# Patient Record
Sex: Male | Born: 1955 | Race: White | Hispanic: No | Marital: Single | Smoking: Never smoker
Health system: Southern US, Community
[De-identification: ages and names within clinical notes are randomized; demographics above are authoritative.]

---

## 2006-11-11 ENCOUNTER — Other Ambulatory Visit: Payer: Self-pay

## 2006-11-11 ENCOUNTER — Ambulatory Visit: Payer: Self-pay | Admitting: Urology

## 2006-11-12 ENCOUNTER — Ambulatory Visit: Payer: Self-pay | Admitting: Urology

## 2007-01-11 ENCOUNTER — Ambulatory Visit: Payer: Self-pay | Admitting: Family Medicine

## 2007-12-23 ENCOUNTER — Ambulatory Visit: Payer: Self-pay | Admitting: Orthopedic Surgery

## 2009-07-05 IMAGING — CR DG KNEE COMPLETE 4+V*L*
1 series · 4 of 4 positions shown · non-contrast
Comparison: none

REASON FOR EXAM: Pain
COMMENTS:

PROCEDURE:     KDR - KDXR KNEE LT COMP WITH OBLIQUES  - January 11, 2007  [DATE]
RESULT:     There does not appear to be evidence of fracture, dislocation or
malalignment.

[Series 2: view not recorded · 0.17mm/px · 4 of 4 slices shown]
[im 1/4]
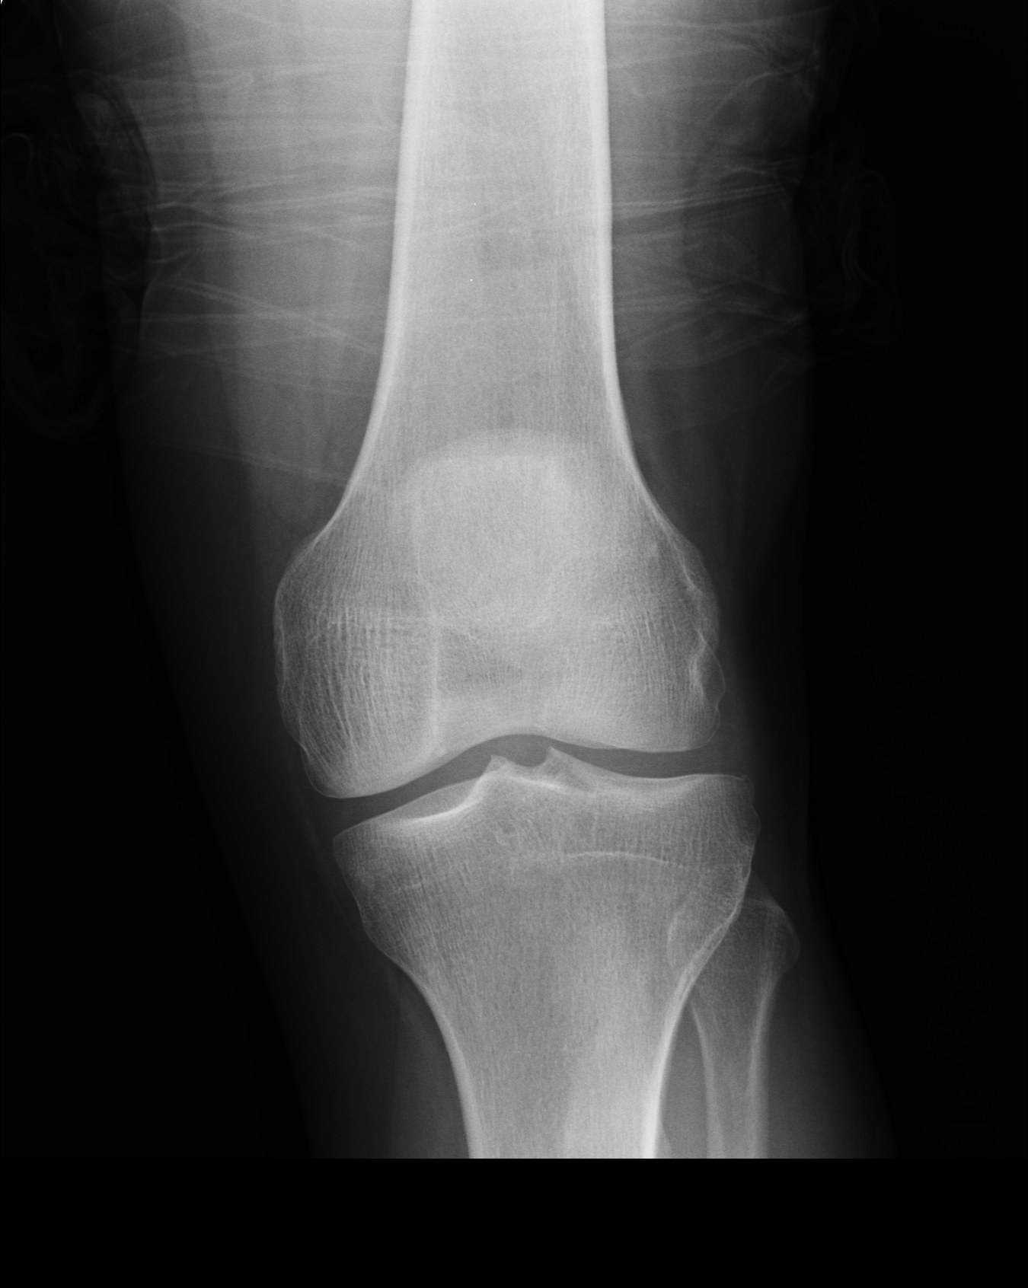
[im 2/4]
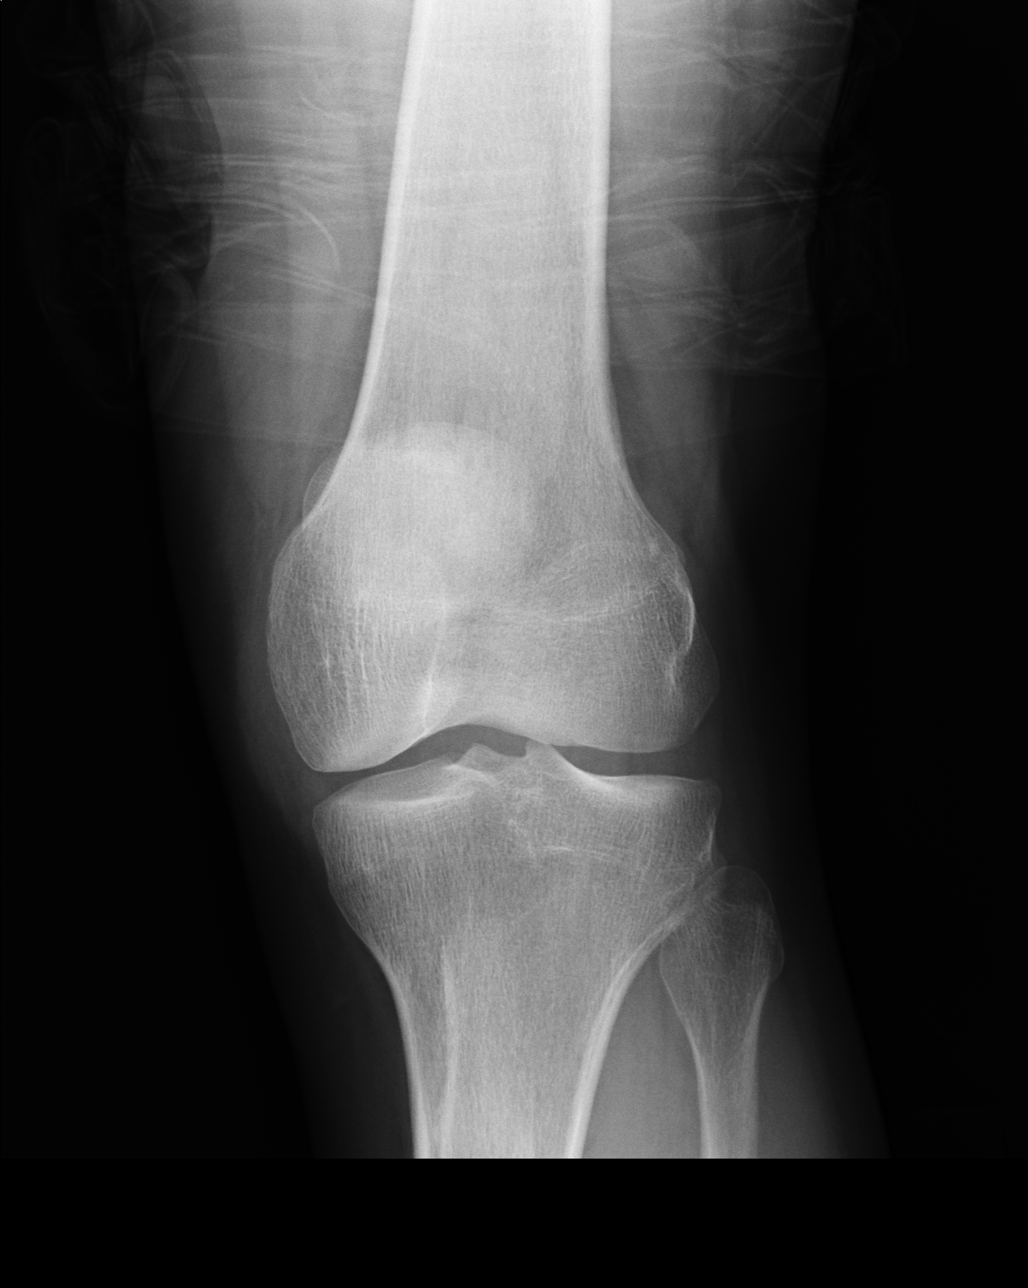
[im 3/4]
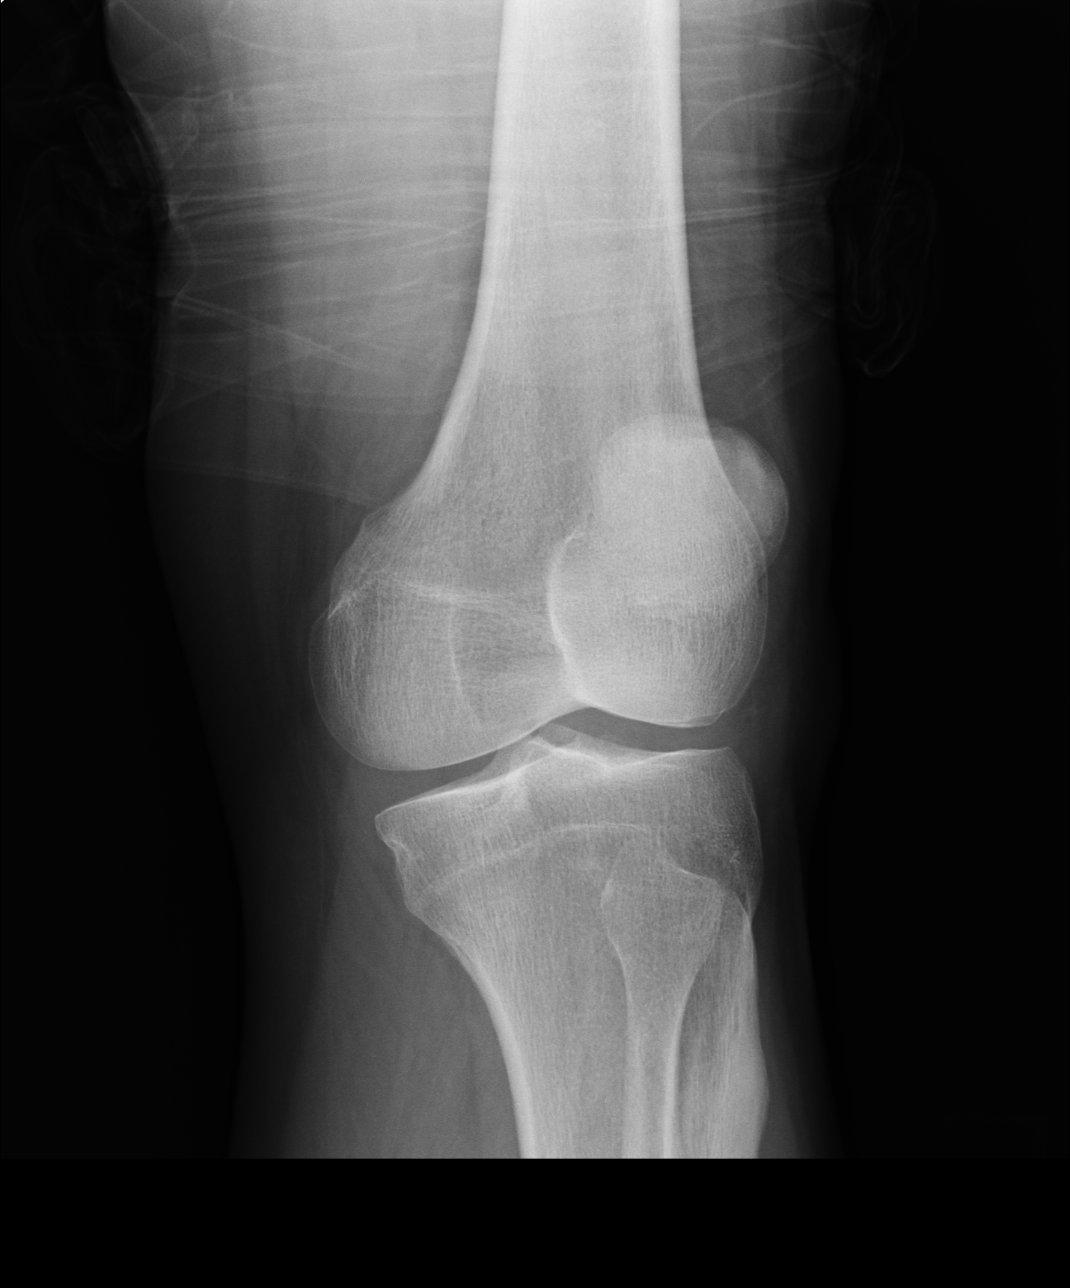
[im 4/4]
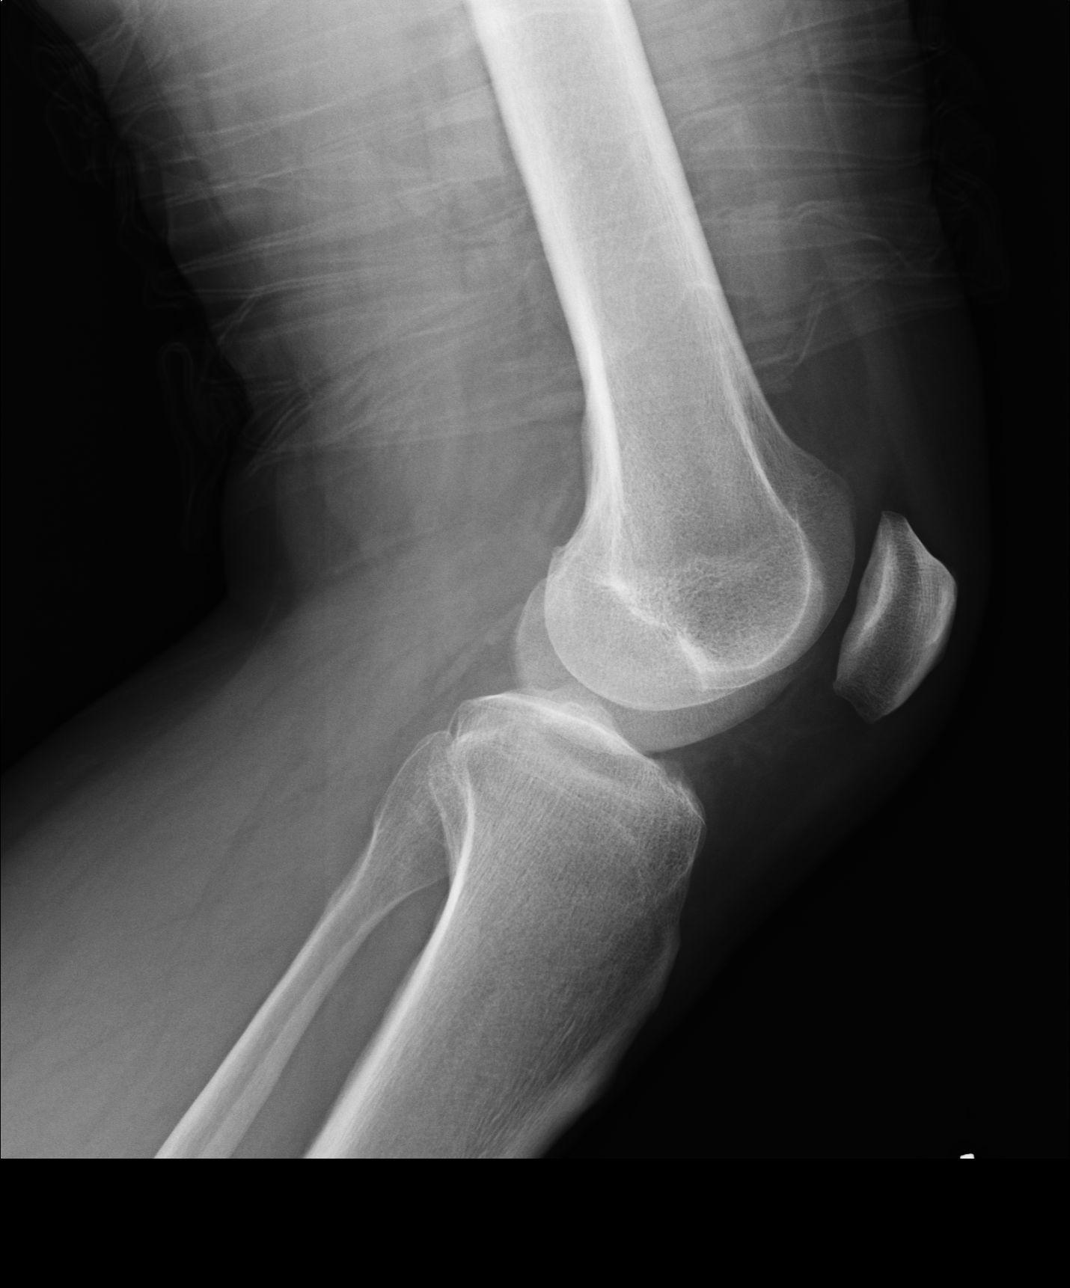

[4 of 4 positions shown; findings below may reference images not displayed]

IMPRESSION: 1.     Unremarkable LEFT knee.
2.     If there is persistent clinical concern or persistent complaints of
pain, repeat evaluation in 7-10 days is recommended, if clinically
warranted.

## 2018-11-04 ENCOUNTER — Ambulatory Visit (INDEPENDENT_AMBULATORY_CARE_PROVIDER_SITE_OTHER): Payer: Self-pay | Admitting: Physician Assistant

## 2018-11-04 ENCOUNTER — Encounter: Payer: Self-pay | Admitting: Orthopedic Surgery

## 2018-11-04 ENCOUNTER — Other Ambulatory Visit: Payer: Self-pay

## 2018-11-04 ENCOUNTER — Ambulatory Visit (INDEPENDENT_AMBULATORY_CARE_PROVIDER_SITE_OTHER): Payer: Self-pay

## 2018-11-04 DIAGNOSIS — G8929 Other chronic pain: Secondary | ICD-10-CM

## 2018-11-04 DIAGNOSIS — M25561 Pain in right knee: Secondary | ICD-10-CM

## 2018-11-04 DIAGNOSIS — M1711 Unilateral primary osteoarthritis, right knee: Secondary | ICD-10-CM

## 2018-11-04 NOTE — Progress Notes (Signed)
Office Visit Note   Patient: Gary Walsh           Date of Birth: 01-21-1956           MRN: [id number] Visit Date: 11/04/2018              Requested by: No referring provider defined for this encounter. PCP: Patient, No Pcp Per  Chief Complaint  Patient presents with  . Right Knee - Pain      HPI: The patient is a 63 year old gentleman who is seen for evaluation of his right knee.  He reports that he has had difficulty with the knee off and on but over the past month it seems to be getting much worse.  He notes pain with ambulation particularly about the medial side of his knee.  He notices some knee stiffness as well.  He does feel like it hurts more with full extension over the medial side and noticed this when he was stretched out trying to rest at night.  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  He is tried nonsteroidal anti-inflammatories with minimal relief of his symptoms.  Assessment & Plan: Visit Diagnoses:  1. Unilateral primary osteoarthritis, right knee   2. Chronic pain of right knee     Plan: After informed consent the patient underwent a steroid injection to the right knee under sterile techniques and he tolerated this well.  We also discussed quadriceps and VMO exercises.  We discussed that he can use Voltaren gel as needed for pain up to 4 times daily.  He will follow-up here in 4 weeks or sooner should he have difficulties in the interim.  Follow-Up Instructions: Return in about 4 weeks (around 12/02/2018).   Ortho Exam  Patient is alert, oriented, no adenopathy, well-dressed, normal affect, normal respiratory effort. Patient ambulates with a minimally antalgic gait on the right.  He does have pain to palpation over the medial joint line.  He has full knee extension but pain on end extension and 120 degrees of flexion.  He has negative drawers and no instability.  He may have a very small effusion.  No signs of  cellulitis or infection.  Imaging: No results found. No images are attached to the encounter.  Labs: No results found for: HGBA1C, ESRSEDRATE, CRP, LABURIC, REPTSTATUS, GRAMSTAIN, CULT, LABORGA   No results found for: ALBUMIN, PREALBUMIN, LABURIC  No results found for: MG No results found for: VD25OH  No results found for: PREALBUMIN No flowsheet data found.   There is no height or weight on file to calculate BMI.  Orders:  Orders Placed This Encounter  Procedures  . Large Joint Inj: R knee  . XR Knee 1-2 Views Right   No orders of the defined types were placed in this encounter.    Procedures: Large Joint Inj: R knee on 11/04/2018 1:07 PM Indications: pain and diagnostic evaluation Details: 22 G 1.5 in needle, anteromedial approach  Arthrogram: No  Medications: 5 mL lidocaine 1 %; 40 mg methylPREDNISolone acetate 40 MG/ML Outcome: tolerated well, no immediate complications Procedure, treatment alternatives, risks and benefits explained, specific risks discussed. Consent was given by the patient. Immediately prior to procedure a time out was called to verify the correct patient, procedure, equipment, support staff and site/side marked as required. Patient was prepped and draped in the usual sterile fashion.      Clinical Data: No additional  findings.  ROS:  All other systems negative, except as noted in the HPI. Review of Systems  Objective: Vital Signs: There were no vitals taken for this visit.  Specialty Comments:  No specialty comments available.  PMFS History: There are no active problems to display for this patient.  History reviewed. No pertinent past medical history.  History reviewed. No pertinent family history.  History reviewed. No pertinent surgical history. Social History   Occupational History  . Not on file  Tobacco Use  . Smoking status: Not on file  Substance and Sexual Activity  . Alcohol use: Not on file  . Drug use: Not on  file  . Sexual activity: Not on file

## 2018-11-07 MED ORDER — METHYLPREDNISOLONE ACETATE 40 MG/ML IJ SUSP
40.0000 mg | INTRAMUSCULAR | Status: AC | PRN
  Administered 2018-11-04: 40 mg via INTRA_ARTICULAR

## 2018-11-07 MED ORDER — LIDOCAINE HCL 1 % IJ SOLN
5.0000 mL | INTRAMUSCULAR | Status: AC | PRN
  Administered 2018-11-04: 5 mL

## 2018-12-02 ENCOUNTER — Ambulatory Visit (INDEPENDENT_AMBULATORY_CARE_PROVIDER_SITE_OTHER): Payer: Self-pay | Admitting: Orthopedic Surgery

## 2018-12-02 ENCOUNTER — Encounter: Payer: Self-pay | Admitting: Orthopedic Surgery

## 2018-12-02 ENCOUNTER — Other Ambulatory Visit: Payer: Self-pay

## 2018-12-02 VITALS — Ht 74.0 in | Wt 200.0 lb

## 2018-12-02 DIAGNOSIS — M1711 Unilateral primary osteoarthritis, right knee: Secondary | ICD-10-CM

## 2018-12-05 ENCOUNTER — Encounter: Payer: Self-pay | Admitting: Orthopedic Surgery

## 2018-12-05 DIAGNOSIS — M1711 Unilateral primary osteoarthritis, right knee: Secondary | ICD-10-CM

## 2018-12-05 MED ORDER — LIDOCAINE HCL 1 % IJ SOLN
5.0000 mL | INTRAMUSCULAR | Status: AC | PRN
  Administered 2018-12-05: 5 mL

## 2018-12-05 MED ORDER — METHYLPREDNISOLONE ACETATE 40 MG/ML IJ SUSP
40.0000 mg | INTRAMUSCULAR | Status: AC | PRN
  Administered 2018-12-05: 40 mg via INTRA_ARTICULAR

## 2018-12-05 NOTE — Progress Notes (Signed)
   Office Visit Note   Patient: Gary Walsh           Date of Birth: 1956/01/09           MRN: [id number] Visit Date: 12/02/2018              Requested by: No referring provider defined for this encounter. PCP: Patient, No Pcp Per  Chief Complaint  Patient presents with  . Right Knee - Follow-up    Cortisone Inj preferred      HPI: Patient is a 63 year old gentleman who is status post right knee injection 4 weeks ago.  He states he had good relief from the injection however the benefits are starting to wear off.  Assessment & Plan: Visit Diagnoses: No diagnosis found.  Plan: Right knee was injected plan to follow-up as needed.  Follow-Up Instructions: No follow-ups on file.   Ortho Exam  Patient is alert, oriented, no adenopathy, well-dressed, normal affect, normal respiratory effort. Patient states he was initially about 80% better after the injection he states the benefits wore off with time.  Examination there is no effusion no redness patient has maximal tenderness palpation of the medial joint line collaterals and cruciates are stable.  Imaging: No results found. No images are attached to the encounter.  Labs: No results found for: HGBA1C, ESRSEDRATE, CRP, LABURIC, REPTSTATUS, GRAMSTAIN, CULT, LABORGA   No results found for: ALBUMIN, PREALBUMIN, LABURIC  No results found for: MG No results found for: VD25OH  No results found for: PREALBUMIN No flowsheet data found.   Body mass index is 25.68 kg/m.  Orders:  No orders of the defined types were placed in this encounter.  No orders of the defined types were placed in this encounter.    Procedures: Large Joint Inj: R knee on 12/05/2018 2:45 PM Indications: pain and diagnostic evaluation Details: 22 G 1.5 in needle, anteromedial approach  Arthrogram: No  Medications: 5 mL lidocaine 1 %; 40 mg methylPREDNISolone acetate 40 MG/ML Outcome: tolerated well, no immediate complications Procedure, treatment  alternatives, risks and benefits explained, specific risks discussed. Consent was given by the patient. Immediately prior to procedure a time out was called to verify the correct patient, procedure, equipment, support staff and site/side marked as required. Patient was prepped and draped in the usual sterile fashion.      Clinical Data: No additional findings.  ROS:  All other systems negative, except as noted in the HPI. Review of Systems  Objective: Vital Signs: Ht 6\' 2"  (1.88 m)   Wt 200 lb (90.7 kg)   BMI 25.68 kg/m   Specialty Comments:  No specialty comments available.  PMFS History: There are no active problems to display for this patient.  No past medical history on file.  No family history on file.  No past surgical history on file. Social History   Occupational History  . Not on file  Tobacco Use  . Smoking status: Never Smoker  . Smokeless tobacco: Never Used  Substance and Sexual Activity  . Alcohol use: Never    Frequency: Never  . Drug use: Never  . Sexual activity: Not on file

## 2022-07-01 ENCOUNTER — Other Ambulatory Visit: Payer: Self-pay | Admitting: "Endocrinology

## 2022-07-01 DIAGNOSIS — E230 Hypopituitarism: Secondary | ICD-10-CM

## 2022-07-07 ENCOUNTER — Ambulatory Visit
Admission: RE | Admit: 2022-07-07 | Discharge: 2022-07-07 | Disposition: A | Discharge status: Home / Self Care | Payer: Medicare Other | Source: Ambulatory Visit | Attending: "Endocrinology | Admitting: "Endocrinology

## 2022-07-07 DIAGNOSIS — E230 Hypopituitarism: Secondary | ICD-10-CM | POA: Diagnosis present

## 2022-07-07 MED ORDER — GADOBUTROL 1 MMOL/ML IV SOLN
7.0000 mL | Freq: Once | INTRAVENOUS | Status: AC | PRN
  Administered 2022-07-07: 7 mL via INTRAVENOUS

## 2022-07-21 ENCOUNTER — Ambulatory Visit (INDEPENDENT_AMBULATORY_CARE_PROVIDER_SITE_OTHER): Payer: Medicare Other | Admitting: Orthopedic Surgery

## 2022-07-21 ENCOUNTER — Other Ambulatory Visit (INDEPENDENT_AMBULATORY_CARE_PROVIDER_SITE_OTHER): Payer: Medicare Other

## 2022-07-21 DIAGNOSIS — G8929 Other chronic pain: Secondary | ICD-10-CM

## 2022-07-21 DIAGNOSIS — M25562 Pain in left knee: Secondary | ICD-10-CM

## 2022-07-25 ENCOUNTER — Ambulatory Visit: Payer: Medicare Other | Admitting: Family

## 2022-08-01 ENCOUNTER — Encounter: Payer: Self-pay | Admitting: Orthopedic Surgery

## 2022-08-01 DIAGNOSIS — M25562 Pain in left knee: Secondary | ICD-10-CM

## 2022-08-01 DIAGNOSIS — G8929 Other chronic pain: Secondary | ICD-10-CM

## 2022-08-01 MED ORDER — METHYLPREDNISOLONE ACETATE 40 MG/ML IJ SUSP
40.0000 mg | INTRAMUSCULAR | Status: AC | PRN
  Administered 2022-08-01: 40 mg via INTRA_ARTICULAR

## 2022-08-01 MED ORDER — LIDOCAINE HCL (PF) 1 % IJ SOLN
5.0000 mL | INTRAMUSCULAR | Status: AC | PRN
  Administered 2022-08-01: 5 mL

## 2022-08-01 NOTE — Progress Notes (Signed)
   Office Visit Note   Patient: Gary Walsh           Date of Birth: [birth year]           MRN: [id number] Visit Date: 07/21/2022              Requested by: Mick Sell, MD [address] [place],  [place] [address] PCP: Mick Sell, MD  Chief Complaint  Patient presents with   Left Knee - Pain      HPI: Patient is a 67 year old gentleman with osteoarthritis of the left knee.  Patient has had previous steroid injection years ago with good relief.  Patient has no mechanical symptoms.  Assessment & Plan: Visit Diagnoses:  1. Chronic pain of left knee     Plan: Left knee was injected without complication.  Follow-up as needed if symptoms worsen or unable to perform actives of daily living.  Follow-Up Instructions: Return if symptoms worsen or fail to improve.   Ortho Exam  Patient is alert, oriented, no adenopathy, well-dressed, normal affect, normal respiratory effort. Examination there is crepitation of patellofemoral joint with range of motion causing cruciates are stable there is tenderness to palpation of the medial joint line.  No mechanical symptoms.  Patient lacks about 10 degrees to full extension and ambulates with a varus knee alignment.  There is no effusion.  Imaging: No results found. No images are attached to the encounter.  Labs: No results found for: "HGBA1C", "ESRSEDRATE", "CRP", "LABURIC", "REPTSTATUS", "GRAMSTAIN", "CULT", "LABORGA"   No results found for: "ALBUMIN", "PREALBUMIN", "CBC"  No results found for: "MG" No results found for: "VD25OH"  No results found for: "PREALBUMIN"     No data to display           There is no height or weight on file to calculate BMI.  Orders:  Orders Placed This Encounter  Procedures   XR Knee 1-2 Views Left   No orders of the defined types were placed in this encounter.    Procedures: Large Joint Inj: L knee on 08/01/2022 3:24 PM Indications: pain and diagnostic  evaluation Details: 22 G 1.5 in needle, anteromedial approach  Arthrogram: No  Medications: 5 mL lidocaine (PF) 1 %; 40 mg methylPREDNISolone acetate 40 MG/ML Outcome: tolerated well, no immediate complications Procedure, treatment alternatives, risks and benefits explained, specific risks discussed. Consent was given by the patient. Immediately prior to procedure a time out was called to verify the correct patient, procedure, equipment, support staff and site/side marked as required. Patient was prepped and draped in the usual sterile fashion.      Clinical Data: No additional findings.  ROS:  All other systems negative, except as noted in the HPI. Review of Systems  Objective: Vital Signs: There were no vitals taken for this visit.  Specialty Comments:  No specialty comments available.  PMFS History: There are no problems to display for this patient.  History reviewed. No pertinent past medical history.  History reviewed. No pertinent family history.  History reviewed. No pertinent surgical history. Social History   Occupational History   Not on file  Tobacco Use   Smoking status: Never   Smokeless tobacco: Never  Substance and Sexual Activity   Alcohol use: Never   Drug use: Never   Sexual activity: Not on file

## 2022-08-05 ENCOUNTER — Encounter: Payer: Self-pay | Admitting: Family

## 2022-08-05 ENCOUNTER — Ambulatory Visit (INDEPENDENT_AMBULATORY_CARE_PROVIDER_SITE_OTHER): Payer: Medicare Other | Admitting: Family

## 2022-08-05 DIAGNOSIS — M1712 Unilateral primary osteoarthritis, left knee: Secondary | ICD-10-CM

## 2022-08-05 DIAGNOSIS — M25562 Pain in left knee: Secondary | ICD-10-CM

## 2022-08-05 DIAGNOSIS — G8929 Other chronic pain: Secondary | ICD-10-CM

## 2022-08-05 NOTE — Progress Notes (Signed)
   Office Visit Note   Patient: Gary Walsh           Date of Birth: 11-26-1955           MRN: [id number] Visit Date: 08/05/2022              Requested by: Mick Sell, MD [address] [place],  [place] [address] PCP: Mick Sell, MD  Chief Complaint  Patient presents with  . Left Knee - Pain      HPI:   Assessment & Plan: Visit Diagnoses: No diagnosis found.  Plan: ***  Follow-Up Instructions: No follow-ups on file.   Ortho Exam  Patient is alert, oriented, no adenopathy, well-dressed, normal affect, normal respiratory effort. ***  Imaging: No results found. No images are attached to the encounter.  Labs: No results found for: "HGBA1C", "ESRSEDRATE", "CRP", "LABURIC", "REPTSTATUS", "GRAMSTAIN", "CULT", "LABORGA"   No results found for: "ALBUMIN", "PREALBUMIN", "CBC"  No results found for: "MG" No results found for: "VD25OH"  No results found for: "PREALBUMIN"     No data to display           There is no height or weight on file to calculate BMI.  Orders:  No orders of the defined types were placed in this encounter.  No orders of the defined types were placed in this encounter.    Procedures: No procedures performed  Clinical Data: No additional findings.  ROS:  All other systems negative, except as noted in the HPI. Review of Systems  Objective: Vital Signs: There were no vitals taken for this visit.  Specialty Comments:  No specialty comments available.  PMFS History: There are no problems to display for this patient.  History reviewed. No pertinent past medical history.  History reviewed. No pertinent family history.  History reviewed. No pertinent surgical history. Social History   Occupational History  . Not on file  Tobacco Use  . Smoking status: Never  . Smokeless tobacco: Never  Substance and Sexual Activity  . Alcohol use: Never  . Drug use: Never  . Sexual activity: Not on file

## 2022-08-06 DIAGNOSIS — M1712 Unilateral primary osteoarthritis, left knee: Secondary | ICD-10-CM

## 2022-08-06 MED ORDER — METHYLPREDNISOLONE ACETATE 40 MG/ML IJ SUSP
40.0000 mg | INTRAMUSCULAR | Status: AC | PRN
Start: 2022-08-06 — End: 2022-08-06
  Administered 2022-08-06: 40 mg via INTRA_ARTICULAR

## 2022-08-06 MED ORDER — LIDOCAINE HCL 1 % IJ SOLN
5.0000 mL | INTRAMUSCULAR | Status: AC | PRN
  Administered 2022-08-06: 5 mL

## 2022-08-22 ENCOUNTER — Ambulatory Visit (INDEPENDENT_AMBULATORY_CARE_PROVIDER_SITE_OTHER): Payer: Medicare Other | Admitting: Family

## 2022-08-22 DIAGNOSIS — M25562 Pain in left knee: Secondary | ICD-10-CM | POA: Diagnosis not present
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
## 2022-08-25 ENCOUNTER — Telehealth: Payer: Self-pay

## 2022-08-25 NOTE — Telephone Encounter (Signed)
VOB submitted for Orthovisc, left knee  

## 2022-08-25 NOTE — Telephone Encounter (Signed)
-----   Message from Adonis Huguenin, NP sent at 08/22/2022  9:27 AM EDT ----- Left knee supp injection

## 2022-08-29 ENCOUNTER — Ambulatory Visit
Admission: RE | Admit: 2022-08-29 | Discharge: 2022-08-29 | Disposition: A | Discharge status: Home / Self Care | Payer: Medicare Other | Source: Ambulatory Visit | Attending: Family | Admitting: Family

## 2022-08-29 DIAGNOSIS — M25562 Pain in left knee: Secondary | ICD-10-CM

## 2022-09-09 NOTE — Progress Notes (Signed)
Would you offer mri review with duda

## 2022-09-18 ENCOUNTER — Ambulatory Visit (INDEPENDENT_AMBULATORY_CARE_PROVIDER_SITE_OTHER): Payer: Medicare Other | Admitting: Orthopedic Surgery

## 2022-09-18 ENCOUNTER — Telehealth: Payer: Self-pay | Admitting: Orthopedic Surgery

## 2022-09-18 DIAGNOSIS — M1712 Unilateral primary osteoarthritis, left knee: Secondary | ICD-10-CM

## 2022-09-18 NOTE — Telephone Encounter (Signed)
Pt had an appt and sent Girlfriend Albin Felling in asking for referral with note to Dr Ollen Gross . Pt phone number is [phone]. He is also asking if Dr Lajoyce Corners to also include MRI. Phone number [phone].

## 2022-09-19 NOTE — Telephone Encounter (Signed)
Please dictate office note from yesterday and then I will send referral. Please see message below.

## 2022-09-22 ENCOUNTER — Other Ambulatory Visit: Payer: Self-pay

## 2022-09-22 ENCOUNTER — Encounter: Payer: Self-pay | Admitting: Orthopedic Surgery

## 2022-09-22 DIAGNOSIS — M1712 Unilateral primary osteoarthritis, left knee: Secondary | ICD-10-CM

## 2022-09-22 NOTE — Progress Notes (Signed)
   Office Visit Note   Patient: Gary Walsh           Date of Birth: 08-23-1955           MRN: [id number] Visit Date: 09/18/2022              Requested by: Mick Sell, MD [address] [place],  [place] [address] PCP: Mick Sell, MD  Chief Complaint  Patient presents with   Left Knee - Follow-up    MRI review       HPI: Patient is a 67 year old gentleman who presents in follow-up for left knee pain.  Patient denies any mechanical catching locking or giving way.  Patient denies any effusions.  Assessment & Plan: Visit Diagnoses:  1. Unilateral primary osteoarthritis, left knee     Plan: Patient's MRI scan shows tricompartmental arthritis with meniscal pathology.  Discussed that arthroscopic intervention could help if there were mechanical symptoms with catching and locking from the meniscal tear.  Do not feel that arthroscopic intervention would help with the arthritic symptoms.  Discussed that conservative treatment would include Voltaren gel or a steroid injection.  Discussed that surgical intervention would be a total knee arthroplasty.  Follow-Up Instructions: Return if symptoms worsen or fail to improve.   Ortho Exam  Patient is alert, oriented, no adenopathy, well-dressed, normal affect, normal respiratory effort. Examination patient has an antalgic gait with varus alignment to the left knee.  There is no effusion.  There is crepitation with range of motion.  He has a flexed knee gait.  Collaterals and cruciates are stable.  Review of the MRI scan shows tricompartmental arthritic changes with meniscal pathology.  Imaging: No results found. No images are attached to the encounter.  Labs: No results found for: "HGBA1C", "ESRSEDRATE", "CRP", "LABURIC", "REPTSTATUS", "GRAMSTAIN", "CULT", "LABORGA"   No results found for: "ALBUMIN", "PREALBUMIN", "CBC"  No results found for: "MG" No results found for: "VD25OH"  No results found for:  "PREALBUMIN"     No data to display           There is no height or weight on file to calculate BMI.  Orders:  No orders of the defined types were placed in this encounter.  No orders of the defined types were placed in this encounter.    Procedures: No procedures performed  Clinical Data: No additional findings.  ROS:  All other systems negative, except as noted in the HPI. Review of Systems  Objective: Vital Signs: There were no vitals taken for this visit.  Specialty Comments:  No specialty comments available.  PMFS History: There are no problems to display for this patient.  History reviewed. No pertinent past medical history.  History reviewed. No pertinent family history.  History reviewed. No pertinent surgical history. Social History   Occupational History   Not on file  Tobacco Use   Smoking status: Never   Smokeless tobacco: Never  Substance and Sexual Activity   Alcohol use: Never   Drug use: Never   Sexual activity: Not on file

## 2022-09-22 NOTE — Telephone Encounter (Signed)
Order in chart

## 2022-09-24 ENCOUNTER — Telehealth: Payer: Self-pay

## 2022-09-24 DIAGNOSIS — M1712 Unilateral primary osteoarthritis, left knee: Secondary | ICD-10-CM

## 2022-09-24 NOTE — Telephone Encounter (Signed)
Talked with patient and advised him that gel injection was approved.  Patient stated that he would like to hold off for right now.  Will call the office back when ready.  See referrals tab

## 2023-01-04 ENCOUNTER — Encounter (HOSPITAL_COMMUNITY): Payer: Self-pay | Admitting: Emergency Medicine

## 2023-01-04 ENCOUNTER — Emergency Department (HOSPITAL_COMMUNITY)
Admission: EM | Admit: 2023-01-04 | Discharge: 2023-01-04 | Disposition: A | Discharge status: Home / Self Care | Payer: Medicare Other | Attending: Emergency Medicine | Admitting: Emergency Medicine

## 2023-01-04 ENCOUNTER — Other Ambulatory Visit: Payer: Self-pay

## 2023-01-04 DIAGNOSIS — R21 Rash and other nonspecific skin eruption: Secondary | ICD-10-CM | POA: Diagnosis present

## 2023-01-04 DIAGNOSIS — T7840XA Allergy, unspecified, initial encounter: Secondary | ICD-10-CM | POA: Insufficient documentation

## 2023-01-04 LAB — BASIC METABOLIC PANEL
Anion gap: 7 (ref 5–15)
BUN: 14 mg/dL (ref 8–23)
CO2: 26 mmol/L (ref 22–32)
Calcium: 8.8 mg/dL — ABNORMAL LOW (ref 8.9–10.3)
Chloride: 104 mmol/L (ref 98–111)
Creatinine, Ser: 0.86 mg/dL (ref 0.61–1.24)
GFR, Estimated: >60 mL/min (ref >60)
Glucose, Bld: 138 mg/dL — ABNORMAL HIGH (ref 70–99)
Potassium: 3.8 mmol/L (ref 3.5–5.1)
Sodium: 137 mmol/L (ref 135–145)

## 2023-01-04 LAB — CBC WITH DIFFERENTIAL/PLATELET
Abs Immature Granulocytes: 0.02 10*3/uL (ref 0.00–0.07)
Basophils Absolute: 0 10*3/uL (ref 0.0–0.1)
Basophils Relative: 0 %
Eosinophils Absolute: 0.1 10*3/uL (ref 0.0–0.5)
Eosinophils Relative: 3 %
HCT: 36.4 % — ABNORMAL LOW (ref 39.0–52.0)
Hemoglobin: 12.2 g/dL — ABNORMAL LOW (ref 13.0–17.0)
Immature Granulocytes: 0 %
Lymphocytes Relative: 27 %
Lymphs Abs: 1.5 10*3/uL (ref 0.7–4.0)
MCH: 30.3 pg (ref 26.0–34.0)
MCHC: 33.5 g/dL (ref 30.0–36.0)
MCV: 90.3 fL (ref 80.0–100.0)
Monocytes Absolute: 0.3 10*3/uL (ref 0.1–1.0)
Monocytes Relative: 6 %
Neutro Abs: 3.5 10*3/uL (ref 1.7–7.7)
Neutrophils Relative %: 64 %
Platelets: 180 10*3/uL (ref 150–400)
RBC: 4.03 MIL/uL — ABNORMAL LOW (ref 4.22–5.81)
RDW: 11.9 % (ref 11.5–15.5)
WBC: 5.5 10*3/uL (ref 4.0–10.5)
nRBC: 0 % (ref 0.0–0.2)

## 2023-01-04 MED ORDER — EPINEPHRINE 0.3 MG/0.3ML IJ SOAJ
0.3000 mg | INTRAMUSCULAR | 0 refills | Status: AC | PRN
Start: 2023-01-04 — End: ?

## 2023-01-04 MED ORDER — DIPHENHYDRAMINE HCL 25 MG PO TABS
25.0000 mg | ORAL_TABLET | Freq: Four times a day (QID) | ORAL | 0 refills | Status: AC | PRN
Start: 2023-01-04 — End: ?

## 2023-01-04 MED ORDER — METHYLPREDNISOLONE SODIUM SUCC 125 MG IJ SOLR
125.0000 mg | Freq: Once | INTRAMUSCULAR | Status: AC
  Administered 2023-01-04: 125 mg via INTRAVENOUS
  Filled 2023-01-04: qty 2

## 2023-01-04 MED ORDER — FAMOTIDINE IN NACL 20-0.9 MG/50ML-% IV SOLN
20.0000 mg | Freq: Once | INTRAVENOUS | Status: AC
  Administered 2023-01-04: 20 mg via INTRAVENOUS
  Filled 2023-01-04: qty 50

## 2023-01-04 MED ORDER — DIPHENHYDRAMINE HCL 50 MG/ML IJ SOLN
25.0000 mg | Freq: Once | INTRAMUSCULAR | Status: AC
  Administered 2023-01-04: 25 mg via INTRAVENOUS
  Filled 2023-01-04: qty 1

## 2023-01-04 NOTE — ED Provider Notes (Signed)
[place] EMERGENCY DEPARTMENT AT [facility] Provider Note   CSN: [id number] Arrival date & time: 01/04/23  1742     History  Chief Complaint  Patient presents with   Rash    Gary Walsh is a 67 y.o. male.  The history is provided by the patient and medical records. No language interpreter was used.  Rash    67 year old male presenting to the ED accompanied by wife for concerns of allergic reaction.  Patient states yesterday afternoon he was walking and he got stung by an insect to his right hand.  Afterward he noticed itchiness about the hand as well as some itchiness to his skin.  He took 1 Benadryl and the next day he noticed itchiness still persist and now he felt his throat a bit irritated and having a swelling sensation.  He took another Benadryl but noticed no significant improvement thus prompting this ER visit.  He does not endorse any lightheadedness or dizziness no wheezing no abdominal cramping.  Denies any other environmental changes no new medication no history of diabetes.  No history of allergic reaction in the past.  Home Medications Prior to Admission medications   Not on File      Allergies    Patient has no known allergies.    Review of Systems   Review of Systems  Skin:  Positive for rash.  All other systems reviewed and are negative.   Physical Exam Updated Vital Signs BP (!) 145/90 (BP Location: Left Arm)   Pulse 69   Temp 98.5 F (36.9 C) (Oral)   Resp 16   Ht 6\' 2"  (1.88 m)   Wt 90 kg   SpO2 98%   BMI 25.47 kg/m  Physical Exam Vitals and nursing note reviewed.  Constitutional:      General: He is not in acute distress.    Appearance: He is well-developed.  HENT:     Head: Atraumatic.     Mouth/Throat:     Mouth: Mucous membranes are moist.     Pharynx: No oropharyngeal exudate or posterior oropharyngeal erythema.  Eyes:     Conjunctiva/sclera: Conjunctivae normal.  Cardiovascular:     Rate and Rhythm: Normal rate  and regular rhythm.     Pulses: Normal pulses.     Heart sounds: Normal heart sounds.  Pulmonary:     Effort: Pulmonary effort is normal.     Breath sounds: Normal breath sounds. No wheezing, rhonchi or rales.  Abdominal:     Palpations: Abdomen is soft.     Tenderness: There is no abdominal tenderness.  Musculoskeletal:     Cervical back: Neck supple.  Skin:    Findings: Rash (Urticaria rash noted to bilateral inner thigh, and right hand) present.  Neurological:     Mental Status: He is alert.     ED Results / Procedures / Treatments   Labs (all labs ordered are listed, but only abnormal results are displayed) Labs Reviewed  BASIC METABOLIC PANEL - Abnormal; Notable for the following components:      Result Value   Glucose, Bld 138 (*)    Calcium 8.8 (*)    All other components within normal limits  CBC WITH DIFFERENTIAL/PLATELET - Abnormal; Notable for the following components:   RBC 4.03 (*)    Hemoglobin 12.2 (*)    HCT 36.4 (*)    All other components within normal limits    EKG None  Radiology No results found.  Procedures Procedures  Medications Ordered in ED Medications  diphenhydrAMINE (BENADRYL) injection 25 mg (25 mg Intravenous Given 01/04/23 1854)  famotidine (PEPCID) IVPB 20 mg premix (20 mg Intravenous New Bag/Given 01/04/23 1856)  methylPREDNISolone sodium succinate (SOLU-MEDROL) 125 mg/2 mL injection 125 mg (125 mg Intravenous Given 01/04/23 1859)    ED Course/ Medical Decision Making/ A&P                                 Medical Decision Making Amount and/or Complexity of Data Reviewed Labs: ordered.  Risk Prescription drug management.   BP (!) 145/90 (BP Location: Left Arm)   Pulse 69   Temp 98.5 F (36.9 C) (Oral)   Resp 16   Ht 6\' 2"  (1.88 m)   Wt 90 kg   SpO2 98%   BMI 25.47 kg/m   65:17 PM  67 year old male presenting to the ED accompanied by wife for concerns of allergic reaction.  Patient states yesterday afternoon he  was walking and he got stung by an insect to his right hand.  Afterward he noticed itchiness about the hand as well as some itchiness to his skin.  He took 1 Benadryl and the next day he noticed itchiness still persist and now he felt his throat a bit irritated and having a swelling sensation.  He took another Benadryl but noticed no significant improvement thus prompting this ER visit.  He does not endorse any lightheadedness or dizziness no wheezing no abdominal cramping.  Denies any other environmental changes no new medication no history of diabetes.  No history of allergic reaction in the past.  On exam, patient is speaking in complete sentences, no evidence of airway compromise, and heart and lung sounds normal.  He does have an urticarial rash noted to his right hand and bilateral thigh suggestive of an allergic reaction.  Throat exam unremarkable, normal phonation.  Will treat for allergic reaction.  Will hold EpiPen at this moment.  -Labs ordered, independently viewed and interpreted by me.  Labs remarkable for reassuring labs -The patient was maintained on a cardiac monitor.  I personally viewed and interpreted the cardiac monitored which showed an underlying rhythm of: NSR -Imaging not considered -This patient presents to the ED for concern of rash, this involves an extensive number of treatment options, and is a complaint that carries with it a high risk of complications and morbidity.  The differential diagnosis includes allergic reaction, anaphylaxis, contact dermatitis, insect bite reaction -Co morbidities that complicate the patient evaluation includes prediabetes -Treatment includes solumedrol, pepcid, benadryl -Reevaluation of the patient after these medicines showed that the patient improved -PCP office notes or outside notes reviewed -Escalation to admission/observation considered: patients feels much better, is comfortable with discharge, and will follow up with PCP -care  discussed with Dr. Fredderick Phenix -Prescription medication considered, patient comfortable with benadryl and epipen -Social Determinant of Health considered         Final Clinical Impression(s) / ED Diagnoses Final diagnoses:  Allergic reaction, initial encounter    Rx / DC Orders ED Discharge Orders          Ordered    diphenhydrAMINE (BENADRYL) 25 MG tablet  Every 6 hours PRN        01/04/23 2110    EPINEPHrine 0.3 mg/0.3 mL IJ SOAJ injection  As needed        01/04/23 2110  Fayrene Helper, PA-C 01/04/23 2125    Rolan Bucco, MD 01/04/23 [phone]

## 2023-01-04 NOTE — Discharge Instructions (Addendum)
You have been evaluated for your symptoms.  Your symptoms likely due to an allergic reaction possibly to insect bite.  You may take Benadryl as needed for itchiness.  If you develop worsening symptoms especially if you are experiencing trouble breathing do not hesitate to use EpiPen and return to ER for reassessment.

## 2023-01-04 NOTE — ED Triage Notes (Addendum)
Pt reports rash on lower legs and right hand swelling since yesterday states he thought he was bit by something. Pt reports "feels like my throat is swelling" x 1 hour. Pt took Benadryl at 1300

## 2023-01-07 ENCOUNTER — Emergency Department (HOSPITAL_COMMUNITY)
Admission: EM | Admit: 2023-01-07 | Discharge: 2023-01-07 | Disposition: A | Discharge status: Home / Self Care | Payer: Medicare Other | Attending: Emergency Medicine | Admitting: Emergency Medicine

## 2023-01-07 ENCOUNTER — Other Ambulatory Visit: Payer: Self-pay

## 2023-01-07 DIAGNOSIS — T7840XA Allergy, unspecified, initial encounter: Secondary | ICD-10-CM | POA: Insufficient documentation

## 2023-01-07 LAB — COMPREHENSIVE METABOLIC PANEL
ALT: 20 U/L (ref 0–44)
AST: 20 U/L (ref 15–41)
Albumin: 4 g/dL (ref 3.5–5.0)
Alkaline Phosphatase: 83 U/L (ref 38–126)
Anion gap: 8 (ref 5–15)
BUN: 21 mg/dL (ref 8–23)
CO2: 22 mmol/L (ref 22–32)
Calcium: 8.5 mg/dL — ABNORMAL LOW (ref 8.9–10.3)
Chloride: 103 mmol/L (ref 98–111)
Creatinine, Ser: 0.97 mg/dL (ref 0.61–1.24)
GFR, Estimated: >60 mL/min (ref >60)
Glucose, Bld: 120 mg/dL — ABNORMAL HIGH (ref 70–99)
Potassium: 5 mmol/L (ref 3.5–5.1)
Sodium: 133 mmol/L — ABNORMAL LOW (ref 135–145)
Total Bilirubin: 1.3 mg/dL — ABNORMAL HIGH (ref 0.3–1.2)
Total Protein: 7 g/dL (ref 6.5–8.1)

## 2023-01-07 LAB — CBC WITH DIFFERENTIAL/PLATELET
Abs Immature Granulocytes: 0.03 10*3/uL (ref 0.00–0.07)
Basophils Absolute: 0 10*3/uL (ref 0.0–0.1)
Basophils Relative: 0 %
Eosinophils Absolute: 0 10*3/uL (ref 0.0–0.5)
Eosinophils Relative: 1 %
HCT: 40 % (ref 39.0–52.0)
Hemoglobin: 13.4 g/dL (ref 13.0–17.0)
Immature Granulocytes: 0 %
Lymphocytes Relative: 26 %
Lymphs Abs: 1.8 10*3/uL (ref 0.7–4.0)
MCH: 30.3 pg (ref 26.0–34.0)
MCHC: 33.5 g/dL (ref 30.0–36.0)
MCV: 90.5 fL (ref 80.0–100.0)
Monocytes Absolute: 0.3 10*3/uL (ref 0.1–1.0)
Monocytes Relative: 4 %
Neutro Abs: 4.9 10*3/uL (ref 1.7–7.7)
Neutrophils Relative %: 69 %
Platelets: 190 10*3/uL (ref 150–400)
RBC: 4.42 MIL/uL (ref 4.22–5.81)
RDW: 12.2 % (ref 11.5–15.5)
WBC: 7.2 10*3/uL (ref 4.0–10.5)
nRBC: 0 % (ref 0.0–0.2)

## 2023-01-07 MED ORDER — DIPHENHYDRAMINE HCL 50 MG/ML IJ SOLN
25.0000 mg | Freq: Once | INTRAMUSCULAR | Status: AC
  Administered 2023-01-07: 25 mg via INTRAVENOUS
  Filled 2023-01-07: qty 1

## 2023-01-07 MED ORDER — DIPHENHYDRAMINE HCL 25 MG PO TABS
25.0000 mg | ORAL_TABLET | Freq: Four times a day (QID) | ORAL | 0 refills | Status: AC | PRN
Start: 2023-01-07 — End: ?

## 2023-01-07 MED ORDER — METHYLPREDNISOLONE SODIUM SUCC 125 MG IJ SOLR
125.0000 mg | Freq: Once | INTRAMUSCULAR | Status: AC
  Administered 2023-01-07: 125 mg via INTRAVENOUS
  Filled 2023-01-07: qty 2

## 2023-01-07 MED ORDER — FAMOTIDINE 20 MG PO TABS
20.0000 mg | ORAL_TABLET | Freq: Every day | ORAL | 0 refills | Status: AC
Start: 2023-01-07 — End: 2023-01-12

## 2023-01-07 MED ORDER — FAMOTIDINE IN NACL 20-0.9 MG/50ML-% IV SOLN
20.0000 mg | Freq: Once | INTRAVENOUS | Status: AC
  Administered 2023-01-07: 20 mg via INTRAVENOUS
  Filled 2023-01-07: qty 50

## 2023-01-07 MED ORDER — PREDNISONE 20 MG PO TABS
40.0000 mg | ORAL_TABLET | Freq: Every day | ORAL | 0 refills | Status: AC
Start: 2023-01-07 — End: 2023-01-12

## 2023-01-07 NOTE — ED Triage Notes (Signed)
Pt reports waking up with swelling to the face this morning, rash all over body and voice hoarseness that started 1 hr pta. Benadryl at 0900. Pt was seen for same 3 days ago.

## 2023-01-07 NOTE — ED Provider Notes (Signed)
[place] EMERGENCY DEPARTMENT AT [facility] Provider Note   CSN: [id number] Arrival date & time: 01/07/23  1519     History  Chief Complaint  Patient presents with   Allergic Reaction    Gary Walsh is a 67 y.o. male with no significant past medical history who presents to the ED due to possible allergic reaction.  Patient admits to facial edema after waking this morning around 4AM.  Was seen in the ED on 9/22 for a possible allergic reaction to an insect bite.  He notes symptoms initially improved after his ED visit however, worsened earlier today.  Admits to facial edema which has improved, rash, and pruritus.  Denies vomiting.  Denies difficulties breathing.  Patient sent by UC due to possible allergic reaction.  She is not on any medications (only OTC vitamins).  No recent antibiotics.  Denies recent tick bites however, notes he has found numerous ticks in his yard.  Had red meat last night.  Concerned about tickborne illnesses.  No new products or laundry detergent.  No known allergies.  History obtained from patient and past medical records. No interpreter used during encounter.       Home Medications Prior to Admission medications   Medication Sig Start Date End Date Taking? Authorizing Provider  diphenhydrAMINE (BENADRYL) 25 MG tablet Take 1 tablet (25 mg total) by mouth every 6 (six) hours as needed. 01/07/23  Yes Mckenzee Beem, Merla Riches, PA-C  famotidine (PEPCID) 20 MG tablet Take 1 tablet (20 mg total) by mouth daily for 5 days. 01/07/23 01/12/23 Yes Starletta Houchin, Merla Riches, PA-C  predniSONE (DELTASONE) 20 MG tablet Take 2 tablets (40 mg total) by mouth daily for 5 days. 01/07/23 01/12/23 Yes Jonel Sick, Merla Riches, PA-C  diphenhydrAMINE (BENADRYL) 25 MG tablet Take 1 tablet (25 mg total) by mouth every 6 (six) hours as needed. 01/04/23   Fayrene Helper, PA-C  EPINEPHrine 0.3 mg/0.3 mL IJ SOAJ injection Inject 0.3 mg into the muscle as needed for anaphylaxis. 01/04/23   Fayrene Helper, PA-C      Allergies    Patient has no known allergies.    Review of Systems   Review of Systems  Constitutional:  Negative for fever.  Respiratory:  Negative for shortness of breath.   Skin:  Positive for rash.    Physical Exam Updated Vital Signs BP (!) 150/85 (BP Location: Right Arm)   Pulse 85   Temp 99.3 F (37.4 C) (Oral)   Resp 18   Ht 6\' 2"  (1.88 m)   Wt 90 kg   SpO2 100%   BMI 25.47 kg/m  Physical Exam Vitals and nursing note reviewed.  Constitutional:      General: He is not in acute distress.    Appearance: He is not ill-appearing.  HENT:     Head: Normocephalic.     Mouth/Throat:     Comments: No angioedema. Airway patent.  Eyes:     Pupils: Pupils are equal, round, and reactive to light.  Cardiovascular:     Rate and Rhythm: Normal rate and regular rhythm.     Pulses: Normal pulses.     Heart sounds: Normal heart sounds. No murmur heard.    No friction rub. No gallop.  Pulmonary:     Effort: Pulmonary effort is normal.     Breath sounds: Normal breath sounds.     Comments: Respirations equal and unlabored, patient able to speak in full sentences, lungs clear to auscultation bilaterally Abdominal:  General: Abdomen is flat. There is no distension.     Palpations: Abdomen is soft.     Tenderness: There is no abdominal tenderness. There is no guarding or rebound.  Musculoskeletal:        General: Normal range of motion.     Cervical back: Neck supple.  Skin:    General: Skin is warm and dry.     Comments: Rash to inner thighs and trunk. See photos below.   Neurological:     General: No focal deficit present.     Mental Status: He is alert.  Psychiatric:        Mood and Affect: Mood normal.        Behavior: Behavior normal.        ED Results / Procedures / Treatments   Labs (all labs ordered are listed, but only abnormal results are displayed) Labs Reviewed  COMPREHENSIVE METABOLIC PANEL - Abnormal; Notable for the following  components:      Result Value   Sodium 133 (*)    Glucose, Bld 120 (*)    Calcium 8.5 (*)    Total Bilirubin 1.3 (*)    All other components within normal limits  CBC WITH DIFFERENTIAL/PLATELET  LYME DISEASE SEROLOGY W/REFLEX    EKG None  Radiology No results found.  Procedures Procedures    Medications Ordered in ED Medications  diphenhydrAMINE (BENADRYL) injection 25 mg (25 mg Intravenous Given 01/07/23 1738)  methylPREDNISolone sodium succinate (SOLU-MEDROL) 125 mg/2 mL injection 125 mg (125 mg Intravenous Given 01/07/23 1738)  famotidine (PEPCID) IVPB 20 mg premix (0 mg Intravenous Stopped 01/07/23 1814)    ED Course/ Medical Decision Making/ A&P Clinical Course as of 01/07/23 1942  Wed Jan 07, 2023  1836 Reassessed patient. Pruritus resolved. Lungs clear to auscultation bilaterally. No stridor or wheeze.  [CA]  1936 [facility] lab keeps auto-cancelling. Discussed with lab who has a call out to IT. They note once gold top is drawn, patient can be discharged while they work on the order from their end. [CA]    Clinical Course User Index [CA] Mannie Stabile, PA-C                                 Medical Decision Making Amount and/or Complexity of Data Reviewed Independent Historian: friend Labs: ordered. Decision-making details documented in ED Course.  Risk OTC drugs. Prescription drug management.   This patient presents to the ED for concern of rash, swelling, this involves an extensive number of treatment options, and is a complaint that carries with it a high risk of complications and morbidity.  The differential diagnosis includes allergic reaction, tickborne illness, anaphylaxis, etc  67 year old male presents to the ED due to possible allergic reaction.  Patient got bit by an unknown insect on 9/22 and developed sudden onset of edema and rash.  Seen in the ED 3 days ago and was treated for an allergic reaction.  Patient states symptoms initially improved  however, worsened again this morning.  No new medications.  Not on any ACEIs. Woke up this morning with facial edema which resolved.  Denies shortness of breath.  No vomiting or abdominal pain.  Upon arrival patient afebrile, not tachycardic or hypoxic.  Patient in no acute distress.  Lungs clear to auscultation bilaterally.  No stridor or wheeze.  No angioedema.  Airway patent.  Rash to trunk and bilateral inner thighs.  Routine labs ordered.  Tickborne labs ordered due to concern from patient.  IV Solu-Medrol, Pepcid, Benadryl given.  Will hold off on epinephrine at this time given no evidence of respiratory distress or airway compromise. Low suspicion for anaphylaxis.   CBC unremarkable.  No leukocytosis.  Normal hemoglobin.  CMP significant for mild hyponatremia 133.  Normal LFTs.  Normal renal function. Lyme and RMSF pending.  7:38 PM reassessed patient at bedside.  No angioedema.  Airway patent.  Lungs clear to auscultation bilaterally without stridor or wheeze.  Patient admits to some improvement in pruritus and rash.  Suspect symptoms likely related to allergic reaction.  Patient not currently on any medications that can cause angioedema.  Will discharge patient with prednisone, Pepcid, and Benadryl x 5 days.  Advised patient to follow-up with PCP in 2 to 3 days for recheck.  Patient already has an allergy clinic appointment.  No signs of respiratory distress.  Low suspicion for anaphylaxis.  Patient has EpiPen at pharmacy waiting to be picked up.  Patient stable for discharge. Strict ED precautions discussed with patient. Patient states understanding and agrees to plan. Patient discharged home in no acute distress and stable vitals  Lives at home Has PCP       Final Clinical Impression(s) / ED Diagnoses Final diagnoses:  Allergic reaction, initial encounter    Rx / DC Orders ED Discharge Orders          Ordered    predniSONE (DELTASONE) 20 MG tablet  Daily        01/07/23 1724     famotidine (PEPCID) 20 MG tablet  Daily        01/07/23 1724    diphenhydrAMINE (BENADRYL) 25 MG tablet  Every 6 hours PRN        01/07/23 1724              Jesusita Oka 01/07/23 1942    Jacalyn Lefevre, MD 01/07/23 1948

## 2023-01-07 NOTE — Discharge Instructions (Addendum)
It was a pleasure taking care of you today.  As discussed, I am sending you home with Benadryl, Pepcid, and prednisone for the next 5 days.  Take as prescribed.  Please follow-up with PCP in the next 2 to 3 days for recheck.  Return to the ER for any new or worsening symptoms.

## 2023-01-13 LAB — SPOTTED FEVER GROUP ANTIBODIES
Spotted Fever Group IgG: <1:64 {titer}
Spotted Fever Group IgM: <1:64 {titer}
